# Patient Record
Sex: Female | Born: 1985 | Race: Black or African American | Hispanic: No | Marital: Single | Smoking: Current every day smoker
Health system: Southern US, Community
[De-identification: ages and names within clinical notes are randomized; demographics above are authoritative.]

## PROBLEM LIST (undated history)

## (undated) HISTORY — PX: TUBAL LIGATION: SHX77

---

## 2007-06-19 ENCOUNTER — Ambulatory Visit (HOSPITAL_COMMUNITY): Admission: RE | Admit: 2007-06-19 | Discharge: 2007-06-19 | Payer: Self-pay | Admitting: Obstetrics and Gynecology

## 2007-07-16 ENCOUNTER — Ambulatory Visit (HOSPITAL_COMMUNITY): Admission: RE | Admit: 2007-07-16 | Discharge: 2007-07-16 | Payer: Self-pay | Admitting: Obstetrics and Gynecology

## 2007-08-06 ENCOUNTER — Ambulatory Visit (HOSPITAL_COMMUNITY): Admission: RE | Admit: 2007-08-06 | Discharge: 2007-08-06 | Payer: Self-pay | Admitting: Obstetrics and Gynecology

## 2007-08-16 ENCOUNTER — Emergency Department (HOSPITAL_COMMUNITY): Admission: EM | Admit: 2007-08-16 | Discharge: 2007-08-17 | Payer: Self-pay | Admitting: Emergency Medicine

## 2007-09-03 ENCOUNTER — Ambulatory Visit (HOSPITAL_COMMUNITY): Admission: RE | Admit: 2007-09-03 | Discharge: 2007-09-03 | Payer: Self-pay | Admitting: Obstetrics and Gynecology

## 2007-10-03 ENCOUNTER — Ambulatory Visit (HOSPITAL_COMMUNITY): Admission: RE | Admit: 2007-10-03 | Discharge: 2007-10-03 | Payer: Self-pay | Admitting: Obstetrics and Gynecology

## 2007-10-26 ENCOUNTER — Ambulatory Visit (HOSPITAL_COMMUNITY): Admission: RE | Admit: 2007-10-26 | Discharge: 2007-10-26 | Payer: Self-pay | Admitting: Obstetrics and Gynecology

## 2007-12-26 ENCOUNTER — Inpatient Hospital Stay (HOSPITAL_COMMUNITY): Admission: RE | Admit: 2007-12-26 | Discharge: 2007-12-28 | Payer: Self-pay | Admitting: Obstetrics and Gynecology

## 2009-06-22 IMAGING — US US OB NUCHAL TRANSLUCENCY 1ST GEST
1 series · 14 of 28 positions shown · non-contrast
Comparison: none

OBSTETRICAL ULTRASOUND:
 This ultrasound was performed in The [HOSPITAL], and the [facility] report will be stored to [REDACTED] PACS.

[Series 1: us ob nuchal translucency 1st gest · 14 of 32 slices shown]
[im 2/32]
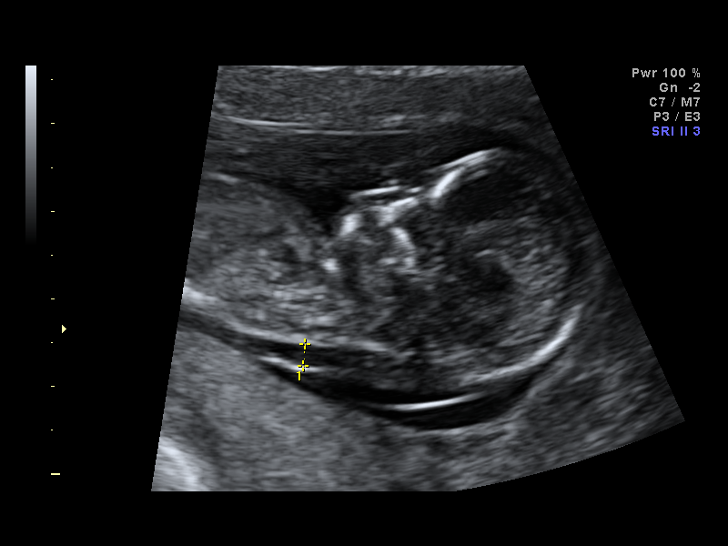
[im 4/32]
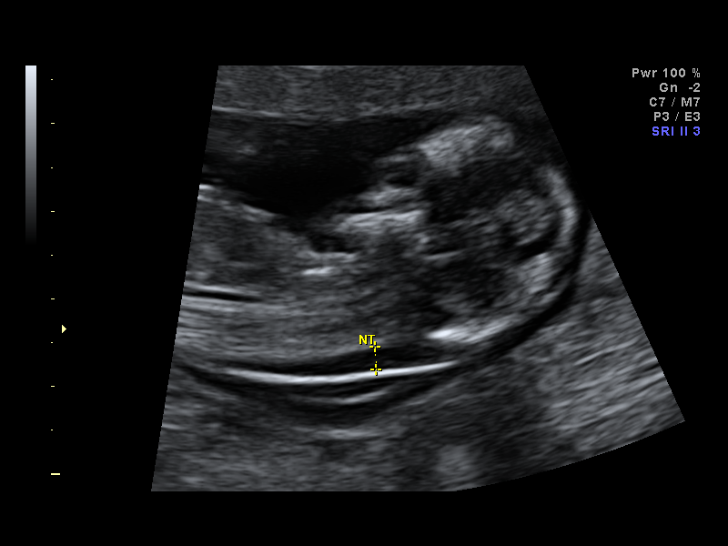
[im 6/32]
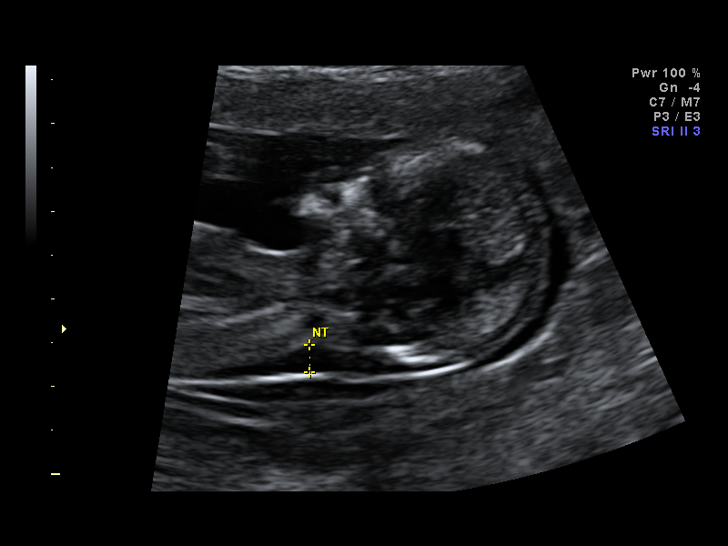
[im 9/32]
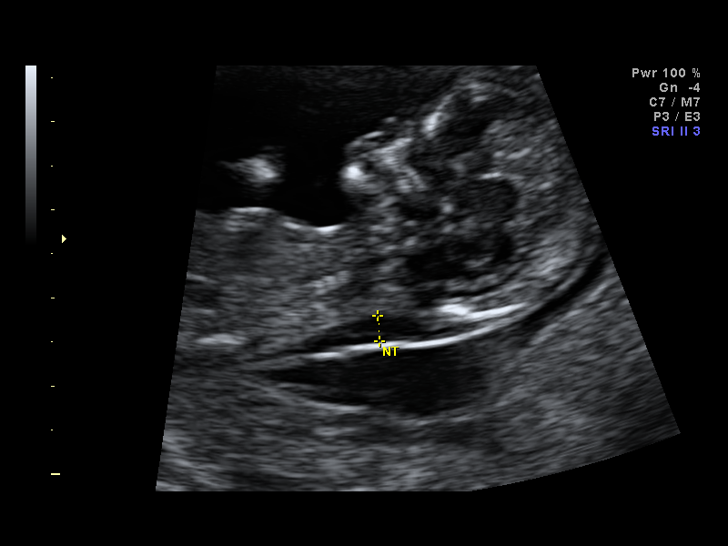
[im 11/32]
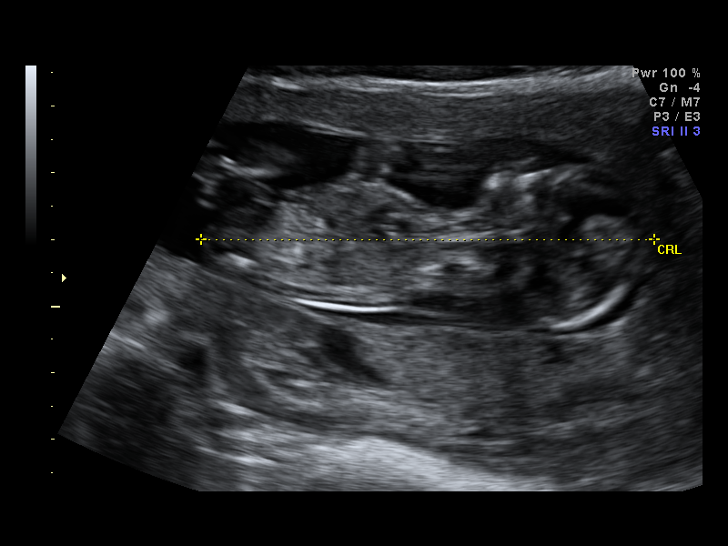
[im 13/32]
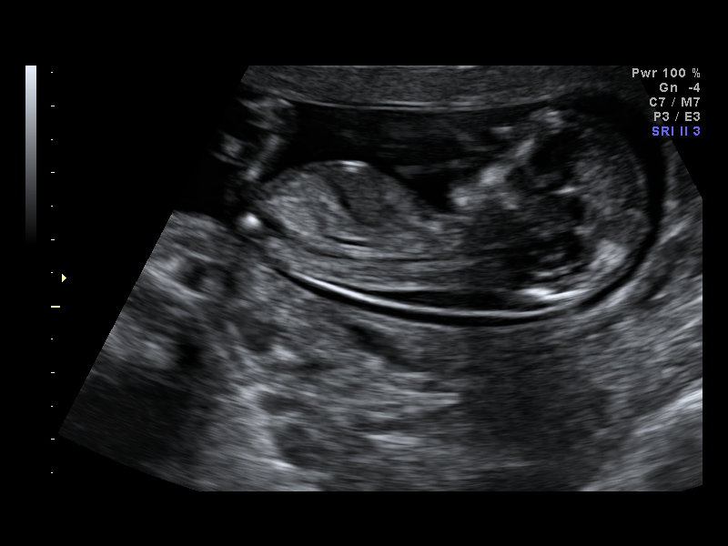
[im 15/32]
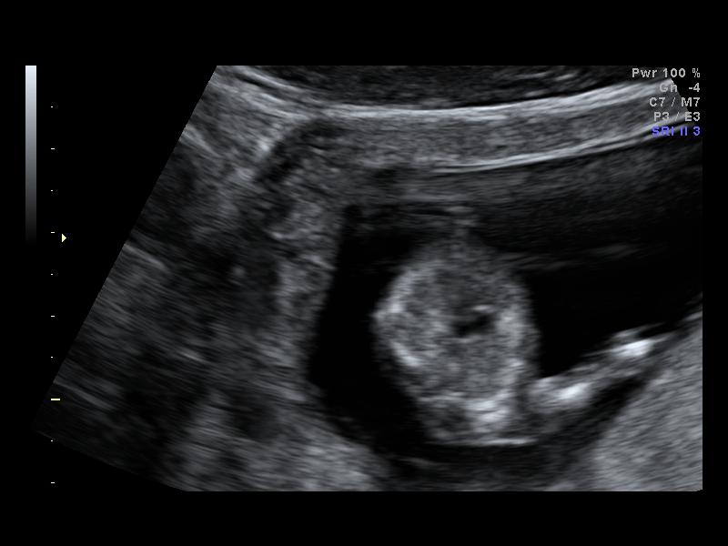
[im 18/32]
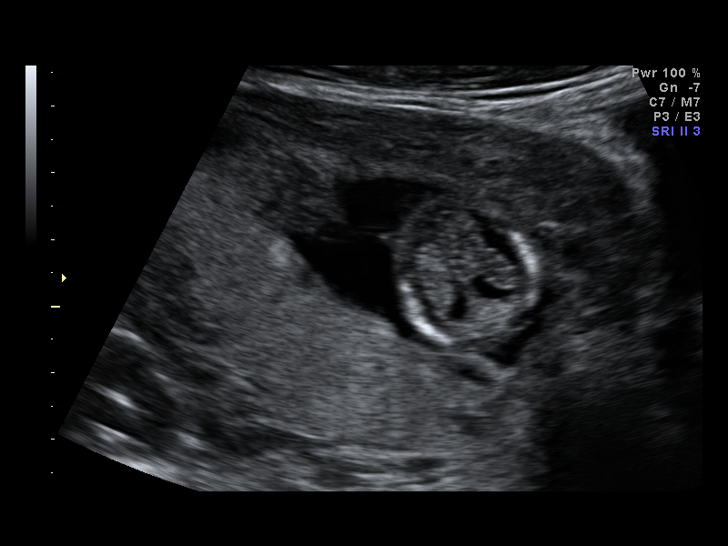
[im 20/32]
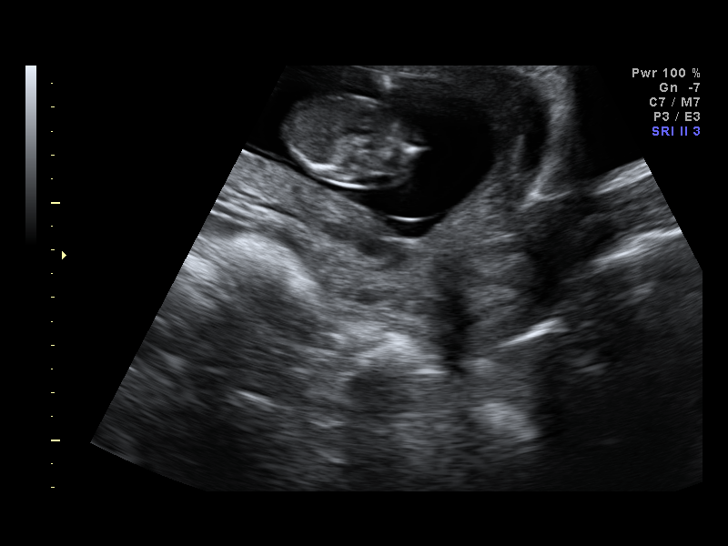
[im 22/32]
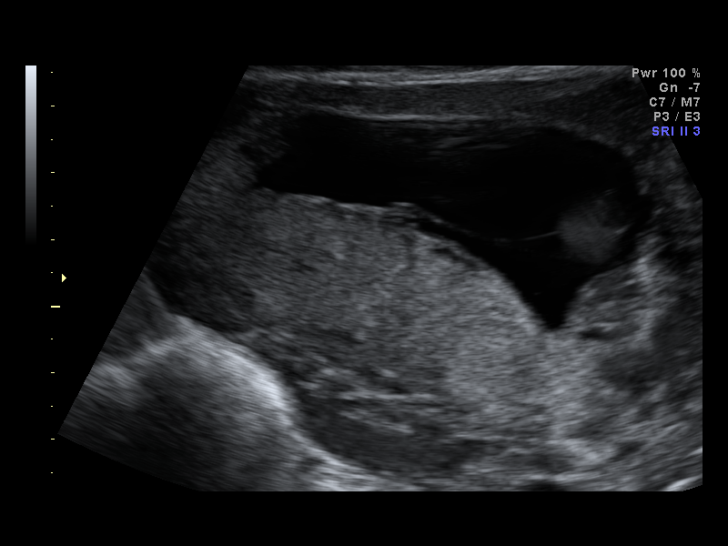
[im 25/32]
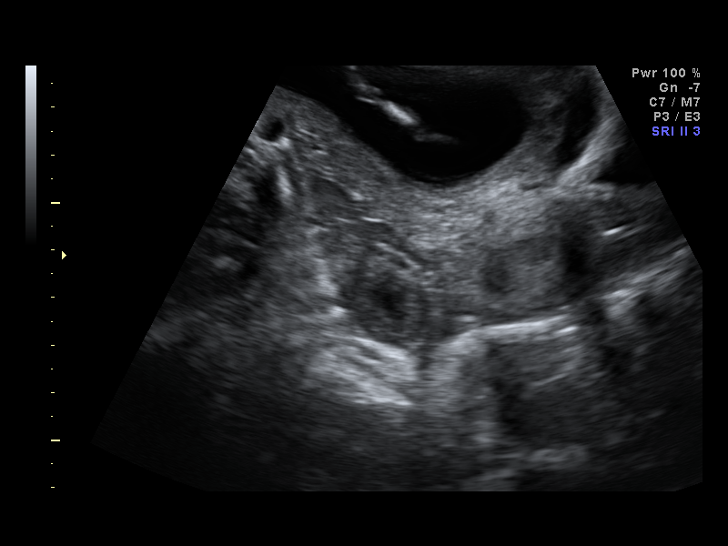
[im 27/32]
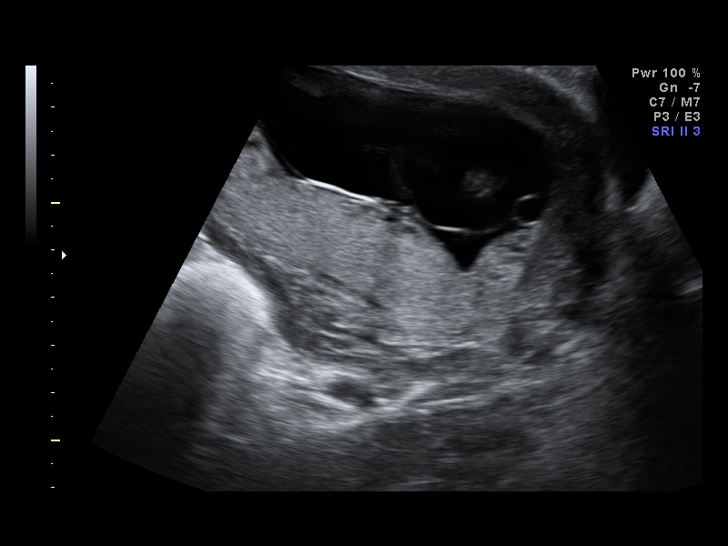
[im 29/32]
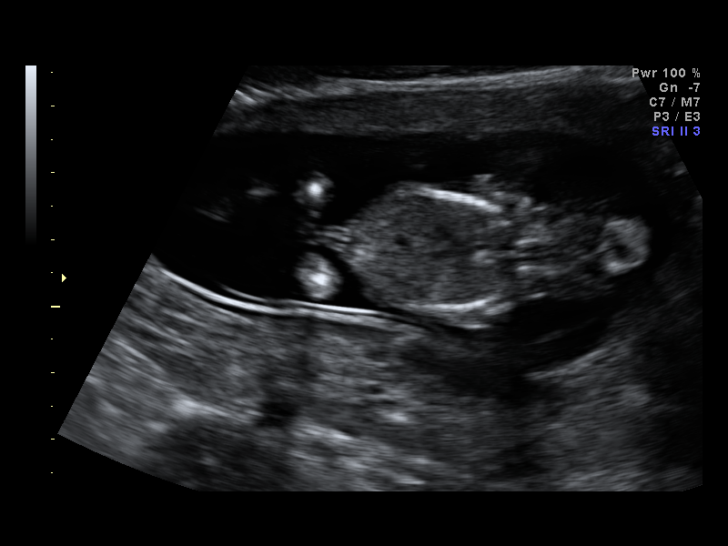
[im 32/32]
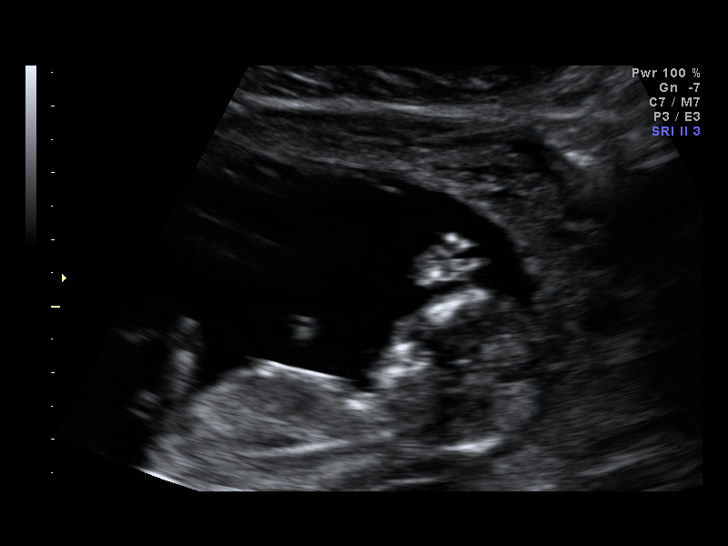

[14 of 28 positions shown; findings below may reference images not displayed]

IMPRESSION: The [facility] report has also been faxed to the ordering physician.

## 2010-05-30 ENCOUNTER — Encounter: Payer: Self-pay | Admitting: Obstetrics and Gynecology

## 2010-09-24 NOTE — Discharge Summary (Signed)
Felicia Cox, Felicia Cox            ACCOUNT NO.:  [id number]   MEDICAL RECORD NO.:  [id number]          PATIENT TYPE:  INP   LOCATION:  9132                          FACILITY:  [facility]   PHYSICIAN:  Malachi Pro. Ambrose Mantle, M.D. DATE OF BIRTH:  [birth year]   DATE OF ADMISSION:  12/26/2007  DATE OF DISCHARGE:  12/28/2007                               DISCHARGE SUMMARY   This is a 25 year old black single female para 0-0-1-0, gravida 2, EDC  December 25, 2007, admitted for induction of labor.  Blood group and type  A+, negative antibody, nonreactive serology, rubella immune, RPR  nonreactive, hepatitis B surface antigen negative, HIV negative, GC and  chlamydia negative, 1-hour Glucola 97.  Group B strep positive.  First  trimester screen 1-5 risk of down syndrome, 1-25 risk of trisomy 38,  amniocentesis 43 xx.  Vaginal ultrasound June 06, 2007.  Crown-rump  length 4.2 cm, 11 weeks 1 day, [facility] December 25, 2007.  Nuchal translucency  was 3.73 mm.  Amnio on July 16, 2007, 46 xx.  Prenatal care otherwise  uncomplicated except for slightly small for gestational age infant.   PAST MEDICAL HISTORY:  No known allergies.  No operations.  No  illnesses.   FAMILY HISTORY:  The patient was adopted.   OBSTETRICAL HISTORY:  In 2007 spontaneous abortion.  Alcohol, tobacco  and drugs none.   PHYSICAL EXAMINATION:  On admission, revealed normal vital signs.  Heart  and lungs were normal.  The abdomen was soft, fundal height 38 cm.  Fetal heart tones normal.  Cervix 1-2 cm, 50% vertex at -3.  The patient  was placed on Pitocin and given IV penicillin by 10:55 a.m.  The cervix  was a fingertip 50% vertex at -2-3.  Artificial rupture of membranes was  attempted, but unsuccessful.  At 1:50 p.m. the patient had already had  spontaneous rupture of membranes.  At approximately 11:00 a.m.  contractions were quite painful.  The cervix was 2 cm, 70% vertex at -2.  Pitocin was at 12 milliunits a minute at 5:20 p.m.  contractions every 3  minutes.  Cervix 4 cm, 80-90% vertex at -1.  The patient requested an  epidural.  She became fully dilated and began pushing.  She pushed well  and the vertex remained ROP.  She delivered spontaneously ROP over a  second-degree midline and right periurethral laceration.  A living  female infant 6, pounds 5 ounces, Apgars of 8 at 1 and 9 at 5 minutes.  Placenta was intact.  Uterus was normal.  Rectal was negative.  Lacerations were repaired with 2-0 and 3-0 Vicryl.  Blood loss about 400  mL.  Postpartum, the patient did well and was discharged on the second  postpartum day.   LABORATORY DATA:  Showed initial hemoglobin of 12.1, hematocrit 35.4,  white count 7900, and platelet count 276,000.  Followup hemoglobin 10.4,  RPR nonreactive.   FINAL DIAGNOSES:  Intrauterine pregnancy at 40 weeks with history of  abnormal first trimester screen, but normal amniocentesis, positive  group B strep, spontaneous delivery vertex, and repair of secondary  midline and right periurethral  lacerations.   FINAL CONDITION:  Improved.   INSTRUCTIONS:  Our regular discharge instruction booklet.  Percocet  5/325 mg 30 tablets, 1 every 6 hours as needed for pain and Motrin 600  mg, 30 tablets 1 every 6 hours as needed.  The patient is to return in 6  weeks.      Malachi Pro. Ambrose Mantle, M.D.  Electronically Signed     TFH/MEDQ  D:  01/10/2008  T:  01/10/2008  Job:  [id number]

## 2012-08-20 ENCOUNTER — Other Ambulatory Visit: Payer: Self-pay | Admitting: Obstetrics and Gynecology

## 2012-08-21 ENCOUNTER — Other Ambulatory Visit (HOSPITAL_COMMUNITY): Payer: Self-pay | Admitting: Obstetrics and Gynecology

## 2012-08-21 DIAGNOSIS — O2692 Pregnancy related conditions, unspecified, second trimester: Secondary | ICD-10-CM

## 2012-08-21 DIAGNOSIS — O358XX0 Maternal care for other (suspected) fetal abnormality and damage, not applicable or unspecified: Secondary | ICD-10-CM

## 2012-08-29 ENCOUNTER — Ambulatory Visit (HOSPITAL_COMMUNITY)
Admission: RE | Admit: 2012-08-29 | Discharge: 2012-08-29 | Disposition: A | Discharge status: Home / Self Care | Payer: Medicaid Other | Source: Ambulatory Visit | Attending: Obstetrics and Gynecology | Admitting: Obstetrics and Gynecology

## 2012-08-29 ENCOUNTER — Other Ambulatory Visit: Payer: Self-pay

## 2012-08-29 ENCOUNTER — Encounter (HOSPITAL_COMMUNITY): Payer: Self-pay

## 2012-08-29 VITALS — BP 118/67 | HR 102 | Wt 145.0 lb

## 2012-08-29 DIAGNOSIS — O358XX0 Maternal care for other (suspected) fetal abnormality and damage, not applicable or unspecified: Secondary | ICD-10-CM | POA: Insufficient documentation

## 2012-08-29 DIAGNOSIS — O2692 Pregnancy related conditions, unspecified, second trimester: Secondary | ICD-10-CM

## 2012-08-29 DIAGNOSIS — O35EXX0 Maternal care for other (suspected) fetal abnormality and damage, fetal genitourinary anomalies, not applicable or unspecified: Secondary | ICD-10-CM

## 2012-08-29 DIAGNOSIS — Z363 Encounter for antenatal screening for malformations: Secondary | ICD-10-CM | POA: Insufficient documentation

## 2012-08-29 DIAGNOSIS — Z1389 Encounter for screening for other disorder: Secondary | ICD-10-CM | POA: Insufficient documentation

## 2012-09-11 ENCOUNTER — Telehealth (HOSPITAL_COMMUNITY): Payer: Self-pay | Admitting: MS"

## 2012-09-11 NOTE — Telephone Encounter (Signed)
Called Felicia Cox to discuss her Harm[facility], cell free fetal DNA testing.  Was unable to leave a message as the voice mail has not been set up; however several hours later the patient saw the missed call and returned the call.  Patient was identified by name and DOB. Testing was offered because of the ultrasound finding of pyelectesis. We reviewed that these are within normal limits, showing a less than 1 in 10,000 risk for trisomies 21, 18 and 13. We reviewed that this testing identifies > 99% of pregnancies with trisomy 21, >98% of pregnancies with trisomy 40, and >80% with trisomy 51; the false positive rate is <0.1% for all conditions. She understands that this testing does not identify all genetic conditions. All questions were answered to her satisfaction, she was encouraged to call with additional questions or concerns.  [name], MS Certified Genetic Counselor

## 2012-10-10 ENCOUNTER — Ambulatory Visit (HOSPITAL_COMMUNITY): Payer: Medicaid Other | Attending: Obstetrics and Gynecology

## 2013-07-04 ENCOUNTER — Encounter (HOSPITAL_COMMUNITY): Payer: Self-pay | Admitting: *Deleted

## 2014-03-10 ENCOUNTER — Encounter (HOSPITAL_COMMUNITY): Payer: Self-pay | Admitting: *Deleted

## 2016-01-25 ENCOUNTER — Emergency Department (HOSPITAL_COMMUNITY)
Admission: EM | Admit: 2016-01-25 | Discharge: 2016-01-25 | Disposition: A | Discharge status: Home / Self Care | Payer: Medicaid Other | Attending: Emergency Medicine | Admitting: Emergency Medicine

## 2016-01-25 ENCOUNTER — Encounter (HOSPITAL_COMMUNITY): Payer: Self-pay | Admitting: Emergency Medicine

## 2016-01-25 DIAGNOSIS — R109 Unspecified abdominal pain: Secondary | ICD-10-CM | POA: Insufficient documentation

## 2016-01-25 DIAGNOSIS — Z5321 Procedure and treatment not carried out due to patient leaving prior to being seen by health care provider: Secondary | ICD-10-CM | POA: Insufficient documentation

## 2016-01-25 DIAGNOSIS — N390 Urinary tract infection, site not specified: Secondary | ICD-10-CM | POA: Insufficient documentation

## 2016-01-25 DIAGNOSIS — F17200 Nicotine dependence, unspecified, uncomplicated: Secondary | ICD-10-CM | POA: Insufficient documentation

## 2016-01-25 LAB — URINE MICROSCOPIC-ADD ON

## 2016-01-25 LAB — COMPREHENSIVE METABOLIC PANEL
ALBUMIN: 3.9 g/dL (ref 3.5–5.0)
ALT: 14 U/L (ref 14–54)
AST: 15 U/L (ref 15–41)
Alkaline Phosphatase: 28 U/L — ABNORMAL LOW (ref 38–126)
Anion gap: 9 (ref 5–15)
BUN: 12 mg/dL (ref 6–20)
CHLORIDE: 99 mmol/L — AB (ref 101–111)
CO2: 24 mmol/L (ref 22–32)
CREATININE: 0.87 mg/dL (ref 0.44–1.00)
Calcium: 9.1 mg/dL (ref 8.9–10.3)
GFR calc Af Amer: >60 mL/min (ref >60)
GLUCOSE: 173 mg/dL — AB (ref 65–99)
Potassium: 3.4 mmol/L — ABNORMAL LOW (ref 3.5–5.1)
Sodium: 132 mmol/L — ABNORMAL LOW (ref 135–145)
Total Bilirubin: 0.8 mg/dL (ref 0.3–1.2)
Total Protein: 7.2 g/dL (ref 6.5–8.1)

## 2016-01-25 LAB — URINALYSIS, ROUTINE W REFLEX MICROSCOPIC
GLUCOSE, UA: NEGATIVE mg/dL
KETONES UR: 15 mg/dL — AB
Nitrite: POSITIVE — AB
PH: 5 (ref 5.0–8.0)
PROTEIN: 100 mg/dL — AB
Specific Gravity, Urine: 1.021 (ref 1.005–1.030)

## 2016-01-25 LAB — CBC
HEMATOCRIT: 38.1 % (ref 36.0–46.0)
Hemoglobin: 12.7 g/dL (ref 12.0–15.0)
MCH: 29.4 pg (ref 26.0–34.0)
MCHC: 33.3 g/dL (ref 30.0–36.0)
MCV: 88.2 fL (ref 78.0–100.0)
PLATELETS: 239 10*3/uL (ref 150–400)
RBC: 4.32 MIL/uL (ref 3.87–5.11)
RDW: 12.9 % (ref 11.5–15.5)
WBC: 13.6 10*3/uL — AB (ref 4.0–10.5)

## 2016-01-25 LAB — PREGNANCY, URINE: PREG TEST UR: NEGATIVE

## 2016-01-25 MED ORDER — SODIUM CHLORIDE 0.9 % IV BOLUS (SEPSIS): 1000.0000 mL | Freq: Once | INTRAVENOUS | Status: DC

## 2016-01-25 MED ORDER — ONDANSETRON HCL 4 MG/2ML IJ SOLN: 4.0000 mg | Freq: Once | INTRAMUSCULAR | Status: DC

## 2016-01-25 MED ORDER — MORPHINE SULFATE (PF) 4 MG/ML IV SOLN: 4.0000 mg | Freq: Once | INTRAVENOUS | Status: DC

## 2016-01-25 MED ORDER — DEXTROSE 5 % IV SOLN: 1.0000 g | Freq: Once | INTRAVENOUS | Status: DC

## 2016-01-25 NOTE — ED Notes (Signed)
Called for Pt to RM, No answer x's 3.

## 2016-01-25 NOTE — ED Provider Notes (Signed)
Patient left without being seen. I didn't establish contact with patient.    Charlynne Panderavid Hsienta Yao, MD 01/25/16 [phone]

## 2016-01-25 NOTE — ED Triage Notes (Signed)
Patient arrives with complaint of dysuria, urgency, frequency, and bilateral flank pain. States onset of all symptoms sans flank pain at 1 week ago. Today the flank pain started. Endorses use of OTC and home remedies for UTI without effect. Denies fever, and normothermic in triage.

## 2016-01-25 NOTE — ED Notes (Signed)
Pts name called for a room no answer 

## 2016-01-27 ENCOUNTER — Emergency Department (HOSPITAL_COMMUNITY): Payer: Self-pay

## 2016-01-27 ENCOUNTER — Emergency Department (HOSPITAL_COMMUNITY)
Admission: EM | Admit: 2016-01-27 | Discharge: 2016-01-27 | Disposition: A | Discharge status: Home / Self Care | Payer: Self-pay | Attending: Emergency Medicine | Admitting: Emergency Medicine

## 2016-01-27 ENCOUNTER — Encounter (HOSPITAL_COMMUNITY): Payer: Self-pay

## 2016-01-27 DIAGNOSIS — N390 Urinary tract infection, site not specified: Secondary | ICD-10-CM | POA: Insufficient documentation

## 2016-01-27 DIAGNOSIS — F17200 Nicotine dependence, unspecified, uncomplicated: Secondary | ICD-10-CM | POA: Insufficient documentation

## 2016-01-27 LAB — COMPREHENSIVE METABOLIC PANEL
ALBUMIN: 3.8 g/dL (ref 3.5–5.0)
ALK PHOS: 35 U/L — AB (ref 38–126)
ALT: 27 U/L (ref 14–54)
AST: 23 U/L (ref 15–41)
Anion gap: 10 (ref 5–15)
BILIRUBIN TOTAL: 0.9 mg/dL (ref 0.3–1.2)
BUN: 11 mg/dL (ref 6–20)
CALCIUM: 9.2 mg/dL (ref 8.9–10.3)
CO2: 20 mmol/L — ABNORMAL LOW (ref 22–32)
Chloride: 104 mmol/L (ref 101–111)
Creatinine, Ser: 0.83 mg/dL (ref 0.44–1.00)
GFR calc Af Amer: >60 mL/min (ref >60)
GFR calc non Af Amer: >60 mL/min (ref >60)
GLUCOSE: 114 mg/dL — AB (ref 65–99)
Potassium: 3.5 mmol/L (ref 3.5–5.1)
Sodium: 134 mmol/L — ABNORMAL LOW (ref 135–145)
TOTAL PROTEIN: 7 g/dL (ref 6.5–8.1)

## 2016-01-27 LAB — CBC WITH DIFFERENTIAL/PLATELET
Basophils Absolute: 0 10*3/uL (ref 0.0–0.1)
Basophils Relative: 0 %
EOS PCT: 0 %
Eosinophils Absolute: 0 10*3/uL (ref 0.0–0.7)
HCT: 38.4 % (ref 36.0–46.0)
Hemoglobin: 13 g/dL (ref 12.0–15.0)
LYMPHS ABS: 0.5 10*3/uL — AB (ref 0.7–4.0)
LYMPHS PCT: 4 %
MCH: 29.4 pg (ref 26.0–34.0)
MCHC: 33.9 g/dL (ref 30.0–36.0)
MCV: 86.9 fL (ref 78.0–100.0)
MONO ABS: 0.5 10*3/uL (ref 0.1–1.0)
Monocytes Relative: 4 %
Neutro Abs: 12.9 10*3/uL — ABNORMAL HIGH (ref 1.7–7.7)
Neutrophils Relative %: 92 %
PLATELETS: 210 10*3/uL (ref 150–400)
RBC: 4.42 MIL/uL (ref 3.87–5.11)
RDW: 13 % (ref 11.5–15.5)
WBC: 14 10*3/uL — AB (ref 4.0–10.5)

## 2016-01-27 LAB — I-STAT BETA HCG BLOOD, ED (MC, WL, AP ONLY): I-stat hCG, quantitative: <5 m[IU]/mL (ref <5)

## 2016-01-27 LAB — I-STAT CG4 LACTIC ACID, ED: Lactic Acid, Venous: 0.93 mmol/L (ref 0.5–1.9)

## 2016-01-27 MED ORDER — STERILE WATER FOR INJECTION IJ SOLN
INTRAMUSCULAR | Status: AC
  Filled 2016-01-27: qty 10

## 2016-01-27 MED ORDER — PHENAZOPYRIDINE HCL 200 MG PO TABS: 200.0000 mg | ORAL_TABLET | Freq: Three times a day (TID) | ORAL | 0 refills | Status: AC | PRN

## 2016-01-27 MED ORDER — CEPHALEXIN 500 MG PO CAPS: 500.0000 mg | ORAL_CAPSULE | Freq: Four times a day (QID) | ORAL | 0 refills | Status: AC

## 2016-01-27 MED ORDER — CEFTRIAXONE SODIUM 1 G IJ SOLR
1.0000 g | Freq: Once | INTRAMUSCULAR | Status: AC
  Administered 2016-01-27: 1 g via INTRAMUSCULAR
  Filled 2016-01-27: qty 10

## 2016-01-27 MED ORDER — HYDROCODONE-ACETAMINOPHEN 5-325 MG PO TABS: 1.0000 | ORAL_TABLET | Freq: Four times a day (QID) | ORAL | 0 refills | Status: AC | PRN

## 2016-01-27 NOTE — ED Triage Notes (Signed)
Pt states that she had a UTI and was taking AZO without relief, now having R sided flank pain and back pain.

## 2016-01-27 NOTE — ED Provider Notes (Signed)
[facility]-EMERGENCY DEPT Provider Note   CSN: [id number] Arrival date & time: 01/27/16  0430     History   Chief Complaint Chief Complaint  Patient presents with  . Urinary Tract Infection  . Blood Infection    HPI Felicia Cox is a 30 y.o. female.  Patient is a 30 year old female who presents with a one-week history of burning with urination. She attempted to take Azo over the counter with little relief. She came in yesterday to be seen, however the wait was too long and she left. Her urinalysis from yesterday reveals a UTI. She also has a white count of 13,000. She returns today with ongoing burning with urination and pain in her right flank. She denies any fevers or chills. She reports one episode of vomiting, but denies diarrhea. Her last menstrual period was 2 weeks ago and normal.      History reviewed. No pertinent past medical history.  There are no active problems to display for this patient.   Past Surgical History:  Procedure Laterality Date  . TUBAL LIGATION      OB History    Gravida Para Term Preterm AB Living   3 1 1  0 1 1   SAB TAB Ectopic Multiple Live Births   1 0 0 0         Home Medications    Prior to Admission medications   Not on File    Family History No family history on file.  Social History Social History  Substance Use Topics  . Smoking status: Current Every Day Smoker  . Smokeless tobacco: Never Used  . Alcohol use No     Allergies   Review of patient's allergies indicates no known allergies.   Review of Systems Review of Systems  All other systems reviewed and are negative.    Physical Exam Updated Vital Signs BP 116/77   Pulse 120   Temp 99.9 F (37.7 C) (Oral)   Resp 20   Ht 5\' 1"  (1.549 m)   Wt 135 lb (61.2 kg)   LMP 01/11/2016 (Exact Date)   SpO2 100%   BMI 25.51 kg/m   Physical Exam  Constitutional: She is oriented to person, place, and time. She appears well-developed and well-nourished. No  distress.  HENT:  Head: Normocephalic and atraumatic.  Neck: Normal range of motion. Neck supple.  Cardiovascular: Normal rate and regular rhythm.  Exam reveals no gallop and no friction rub.   No murmur heard. Pulmonary/Chest: Effort normal and breath sounds normal. No respiratory distress. She has no wheezes.  Abdominal: Soft. Bowel sounds are normal. She exhibits no distension. There is tenderness.  There is mild right sided cva tenderness.  There is also mild right lower quadrant and suprapubic ttp with no rebound or guarding.  Musculoskeletal: Normal range of motion.  Neurological: She is alert and oriented to person, place, and time.  Skin: Skin is warm and dry. She is not diaphoretic.  Nursing note and vitals reviewed.    ED Treatments / Results  Labs (all labs ordered are listed, but only abnormal results are displayed) Labs Reviewed  COMPREHENSIVE METABOLIC PANEL - Abnormal; Notable for the following:       Result Value   Sodium 134 (*)    CO2 20 (*)    Glucose, Bld 114 (*)    Alkaline Phosphatase 35 (*)    All other components within normal limits  CBC WITH DIFFERENTIAL/PLATELET - Abnormal; Notable for the following:  WBC 14.0 (*)    Neutro Abs 12.9 (*)    Lymphs Abs 0.5 (*)    All other components within normal limits  CULTURE, BLOOD (ROUTINE X 2)  CULTURE, BLOOD (ROUTINE X 2)  URINE CULTURE  URINALYSIS, ROUTINE W REFLEX MICROSCOPIC (NOT AT [facility])  I-STAT BETA HCG BLOOD, ED ([facility], [facility], [facility] ONLY)  I-STAT CG4 LACTIC ACID, ED    EKG  EKG Interpretation None       Radiology Dg Chest 2 View  Result Date: 01/27/2016 CLINICAL DATA:  Right-sided back pain. Recent urinary tract infection for 1 week. Back pain 3 days ago. Low grade fever. EXAM: CHEST  2 VIEW COMPARISON:  None. FINDINGS: The heart size and mediastinal contours are within normal limits. Both lungs are clear. The visualized skeletal structures are unremarkable. IMPRESSION: No active cardiopulmonary  disease. Electronically Signed   By: Burman Nieves M.D.   On: 01/27/2016 05:07    Procedures Procedures (including critical care time)  Medications Ordered in ED Medications - No data to display   Initial Impression / Assessment and Plan / ED Course  I have reviewed the triage vital signs and the nursing notes.  Pertinent labs & imaging results that were available during my care of the patient were reviewed by me and considered in my medical decision making (see chart for details).  Clinical Course    Patient will be treated for UTI/Pyelonephritis with rocephin, keflex. To return as needed.   Final Clinical Impressions(s) / ED Diagnoses   Final diagnoses:  None    New Prescriptions New Prescriptions   No medications on file     Geoffery Lyons, MD 01/27/16 [phone]

## 2016-01-27 NOTE — Discharge Instructions (Signed)
Keflex and pyridium as prescribed.  Hydrocodone as prescribed as needed for pain.  Return to the ER if symptoms significantly worsen or change.

## 2016-01-27 NOTE — ED Notes (Signed)
Pt verbalized understanding of discharge instructions and follow up care. Pin pad in room not working at this time.

## 2016-01-28 LAB — URINE CULTURE

## 2016-01-29 ENCOUNTER — Telehealth (HOSPITAL_BASED_OUTPATIENT_CLINIC_OR_DEPARTMENT_OTHER): Payer: Self-pay

## 2016-01-29 LAB — URINE CULTURE

## 2016-01-29 NOTE — Telephone Encounter (Signed)
Post ED Visit - Positive Culture Follow-up  Culture report reviewed by antimicrobial stewardship pharmacist:  []  Felicia Cox, Pharm.D. []  Felicia Cox, Pharm.D., BCPS []  Felicia Cox, Pharm.D. []  Felicia Cox, Pharm.D., BCPS []  Felicia Cox, [address]Ph[address]D., BCPS, AAHIVP []  Felicia Cox, Pharm.D., BCPS, AAHIVP []  Felicia Cox, Pharm.D. []  Felicia Cox, [place]Felicia Cox Pharm D Positive urine culture and no further patient follow-up is required at this time.  Felicia Cox, Felicia Cox 01/29/2016, 9:04 AM

## 2016-01-30 ENCOUNTER — Telehealth (HOSPITAL_BASED_OUTPATIENT_CLINIC_OR_DEPARTMENT_OTHER): Payer: Self-pay

## 2016-01-30 NOTE — Telephone Encounter (Signed)
Post ED Visit - Positive Culture Follow-up  Culture report reviewed by antimicrobial stewardship pharmacist:  []  Enzo BiNathan Batchelder, Pharm.D. []  Celedonio MiyamotoJeremy Frens, Pharm.D., BCPS []  Garvin FilaMike Maccia, Pharm.D. []  Georgina PillionElizabeth Martin, Pharm.D., BCPS []  DoverMinh Pham, [address]Ph[address]D., BCPS, AAHIVP []  Estella HuskMichelle Turner, Pharm.D., BCPS, AAHIVP []  Tennis Mustassie Stewart, Pharm.D. []  Sherle Poeob Vincent, [address] Rachel Rumsbarger Pharm D Positive urine culture Treated with Cephalexin, organism sensitive to the same and no further patient follow-up is required at this time.  Jerry CarasCullom, Savilla Turbyfill Burnett 01/30/2016, 10:05 AM

## 2016-02-01 LAB — CULTURE, BLOOD (ROUTINE X 2)
CULTURE: NO GROWTH
Culture: NO GROWTH

## 2017-11-30 ENCOUNTER — Encounter (HOSPITAL_COMMUNITY): Payer: Self-pay | Admitting: Emergency Medicine

## 2017-11-30 ENCOUNTER — Emergency Department (HOSPITAL_COMMUNITY)
Admission: EM | Admit: 2017-11-30 | Discharge: 2017-11-30 | Disposition: A | Discharge status: Home / Self Care | Payer: Self-pay | Attending: Emergency Medicine | Admitting: Emergency Medicine

## 2017-11-30 DIAGNOSIS — M79602 Pain in left arm: Secondary | ICD-10-CM | POA: Insufficient documentation

## 2017-11-30 DIAGNOSIS — F17200 Nicotine dependence, unspecified, uncomplicated: Secondary | ICD-10-CM | POA: Insufficient documentation

## 2017-11-30 LAB — BASIC METABOLIC PANEL
ANION GAP: 7 (ref 5–15)
BUN: 7 mg/dL (ref 6–20)
CHLORIDE: 107 mmol/L (ref 98–111)
CO2: 25 mmol/L (ref 22–32)
Calcium: 9.3 mg/dL (ref 8.9–10.3)
Creatinine, Ser: 0.89 mg/dL (ref 0.44–1.00)
GFR calc non Af Amer: >60 mL/min (ref >60)
GLUCOSE: 91 mg/dL (ref 70–99)
POTASSIUM: 3.9 mmol/L (ref 3.5–5.1)
Sodium: 139 mmol/L (ref 135–145)

## 2017-11-30 LAB — CBC WITH DIFFERENTIAL/PLATELET
BASOS ABS: 0 10*3/uL (ref 0.0–0.1)
Basophils Relative: 0 %
Eosinophils Absolute: 0.1 10*3/uL (ref 0.0–0.7)
Eosinophils Relative: 1 %
HEMATOCRIT: 37.4 % (ref 36.0–46.0)
HEMOGLOBIN: 12.7 g/dL (ref 12.0–15.0)
LYMPHS PCT: 23 %
Lymphs Abs: 2.2 10*3/uL (ref 0.7–4.0)
MCH: 30.2 pg (ref 26.0–34.0)
MCHC: 34 g/dL (ref 30.0–36.0)
MCV: 88.8 fL (ref 78.0–100.0)
Monocytes Absolute: 0.3 10*3/uL (ref 0.1–1.0)
Monocytes Relative: 4 %
NEUTROS ABS: 6.8 10*3/uL (ref 1.7–7.7)
NEUTROS PCT: 72 %
Platelets: 326 10*3/uL (ref 150–400)
RBC: 4.21 MIL/uL (ref 3.87–5.11)
RDW: 13.2 % (ref 11.5–15.5)
WBC: 9.4 10*3/uL (ref 4.0–10.5)

## 2017-11-30 NOTE — ED Triage Notes (Signed)
Pt is c/o left AC pain from a previous IV stick at the [facility]. Pt stated she had an IV placed and the facility said there was something wrong and removed the IV and placed ice on it and stuck her other arm. A few days later the pt stated that a bruise formed and she is now unable to extend her left arm without feeling like it is "pulling." This has been going on for 3 weeks. Plasma center sent her here for a possible blood clot.

## 2017-11-30 NOTE — Discharge Instructions (Addendum)
Call [phone]

## 2017-12-01 ENCOUNTER — Ambulatory Visit (HOSPITAL_COMMUNITY)
Admission: RE | Admit: 2017-12-01 | Discharge: 2017-12-01 | Disposition: A | Discharge status: Home / Self Care | Payer: Self-pay | Source: Ambulatory Visit | Attending: Physician Assistant | Admitting: Physician Assistant

## 2017-12-01 DIAGNOSIS — M79602 Pain in left arm: Secondary | ICD-10-CM | POA: Insufficient documentation

## 2017-12-01 NOTE — Progress Notes (Signed)
LUE venous duplex prelim: negative for DVT and SVT. Shela Esses Eunice, RDMS, RVT   

## 2017-12-01 NOTE — ED Provider Notes (Signed)
[facility]-EMERGENCY DEPT Provider Note   CSN: [id number] Arrival date & time: 11/30/17  1835     History   Chief Complaint Chief Complaint  Patient presents with  . Arm Pain    HPI Felicia Cox is a 32 y.o. female.  The history is provided by the patient. No language interpreter was used.  Arm Pain  This is a new problem. The current episode started more than 1 week ago. The problem occurs constantly. The problem has been gradually worsening. Pertinent negatives include no shortness of breath. Nothing aggravates the symptoms. Nothing relieves the symptoms. She has tried nothing for the symptoms. The treatment provided no relief.   Pt reports she gave plasm 2 weeks ago.  Pt reports swelling at site since.  Pt sent in by provider at plasma center to evaluate for dvt.  History reviewed. No pertinent past medical history.  There are no active problems to display for this patient.   Past Surgical History:  Procedure Laterality Date  . TUBAL LIGATION       OB History    Gravida  3   Para  1   Term  1   Preterm  0   AB  1   Living  1     SAB  1   TAB  0   Ectopic  0   Multiple  0   Live Births               Home Medications    Prior to Admission medications   Medication Sig Start Date End Date Taking? Authorizing Provider  cephALEXin (KEFLEX) 500 MG capsule Take 1 capsule (500 mg total) by mouth 4 (four) times daily. Patient not taking: Reported on 11/30/2017 01/27/16   Geoffery Lyonselo, Douglas, MD  HYDROcodone-acetaminophen (NORCO) 5-325 MG tablet Take 1-2 tablets by mouth every 6 (six) hours as needed. Patient not taking: Reported on 11/30/2017 01/27/16   Geoffery Lyonselo, Douglas, MD  phenazopyridine (PYRIDIUM) 200 MG tablet Take 1 tablet (200 mg total) by mouth 3 (three) times daily as needed for pain. Patient not taking: Reported on 11/30/2017 01/27/16   Geoffery Lyonselo, Douglas, MD    Family History History reviewed. No pertinent family  history.  Social History Social History   Tobacco Use  . Smoking status: Current Every Day Smoker  . Smokeless tobacco: Never Used  Substance Use Topics  . Alcohol use: No  . Drug use: No     Allergies   Patient has no known allergies.   Review of Systems Review of Systems  Respiratory: Negative for shortness of breath.   Skin: Negative for color change.  All other systems reviewed and are negative.    Physical Exam Updated Vital Signs BP 123/69 (BP Location: Right Arm)   Pulse 86   Temp 98.7 F (37.1 C) (Oral)   Resp 15   Ht 5\' 2"  (1.575 m)   Wt 61.2 kg (135 lb)   SpO2 100%   BMI 24.69 kg/m   Physical Exam  Constitutional: She is oriented to person, place, and time. She appears well-developed and well-nourished.  HENT:  Head: Normocephalic.  Eyes: EOM are normal.  Neck: Normal range of motion.  Pulmonary/Chest: Effort normal.  Abdominal: She exhibits no distension.  Musculoskeletal: Normal range of motion.  Bruised swollen left arm,    Neurological: She is alert and oriented to person, place, and time.  Skin: Skin is warm.  Psychiatric: She has a normal mood and affect.  Nursing note and vitals reviewed.    ED Treatments / Results  Labs (all labs ordered are listed, but only abnormal results are displayed) Labs Reviewed  CBC WITH DIFFERENTIAL/PLATELET  BASIC METABOLIC PANEL    EKG None  Radiology No results found.  Procedures Procedures (including critical care time)  Medications Ordered in ED Medications - No data to display   Initial Impression / Assessment and Plan / ED Course  I have reviewed the triage vital signs and the nursing notes.  Pertinent labs & imaging results that were available during my care of the patient were reviewed by me and considered in my medical decision making (see chart for details).     MDM  Area appears to be bruised.  I am not going to start blood thinners.  Pt is advised to return for vascular  ultrasound  Final Clinical Impressions(s) / ED Diagnoses   Final diagnoses:  Left arm pain    ED Discharge Orders        Ordered    UE VENOUS DUPLEX  Status:  Canceled     11/30/17 1939    VAS [place] UPPER EXTREMITY VENOUS DUPLEX     11/30/17 1939    An After Visit Summary was printed and given to the patient.    Elson Areas, [place] 12/01/17 1630    Linwood Dibbles, MD 12/04/17 1031

## 2018-01-30 IMAGING — DX DG CHEST 2V
2 series · 2 of 2 positions shown · non-contrast
Comparison: None.

CLINICAL DATA: Right-sided back pain. Recent urinary tract
infection for 1 week. Back pain 3 days ago. Low grade fever.

EXAM:
CHEST  2 VIEW

[chest pa]
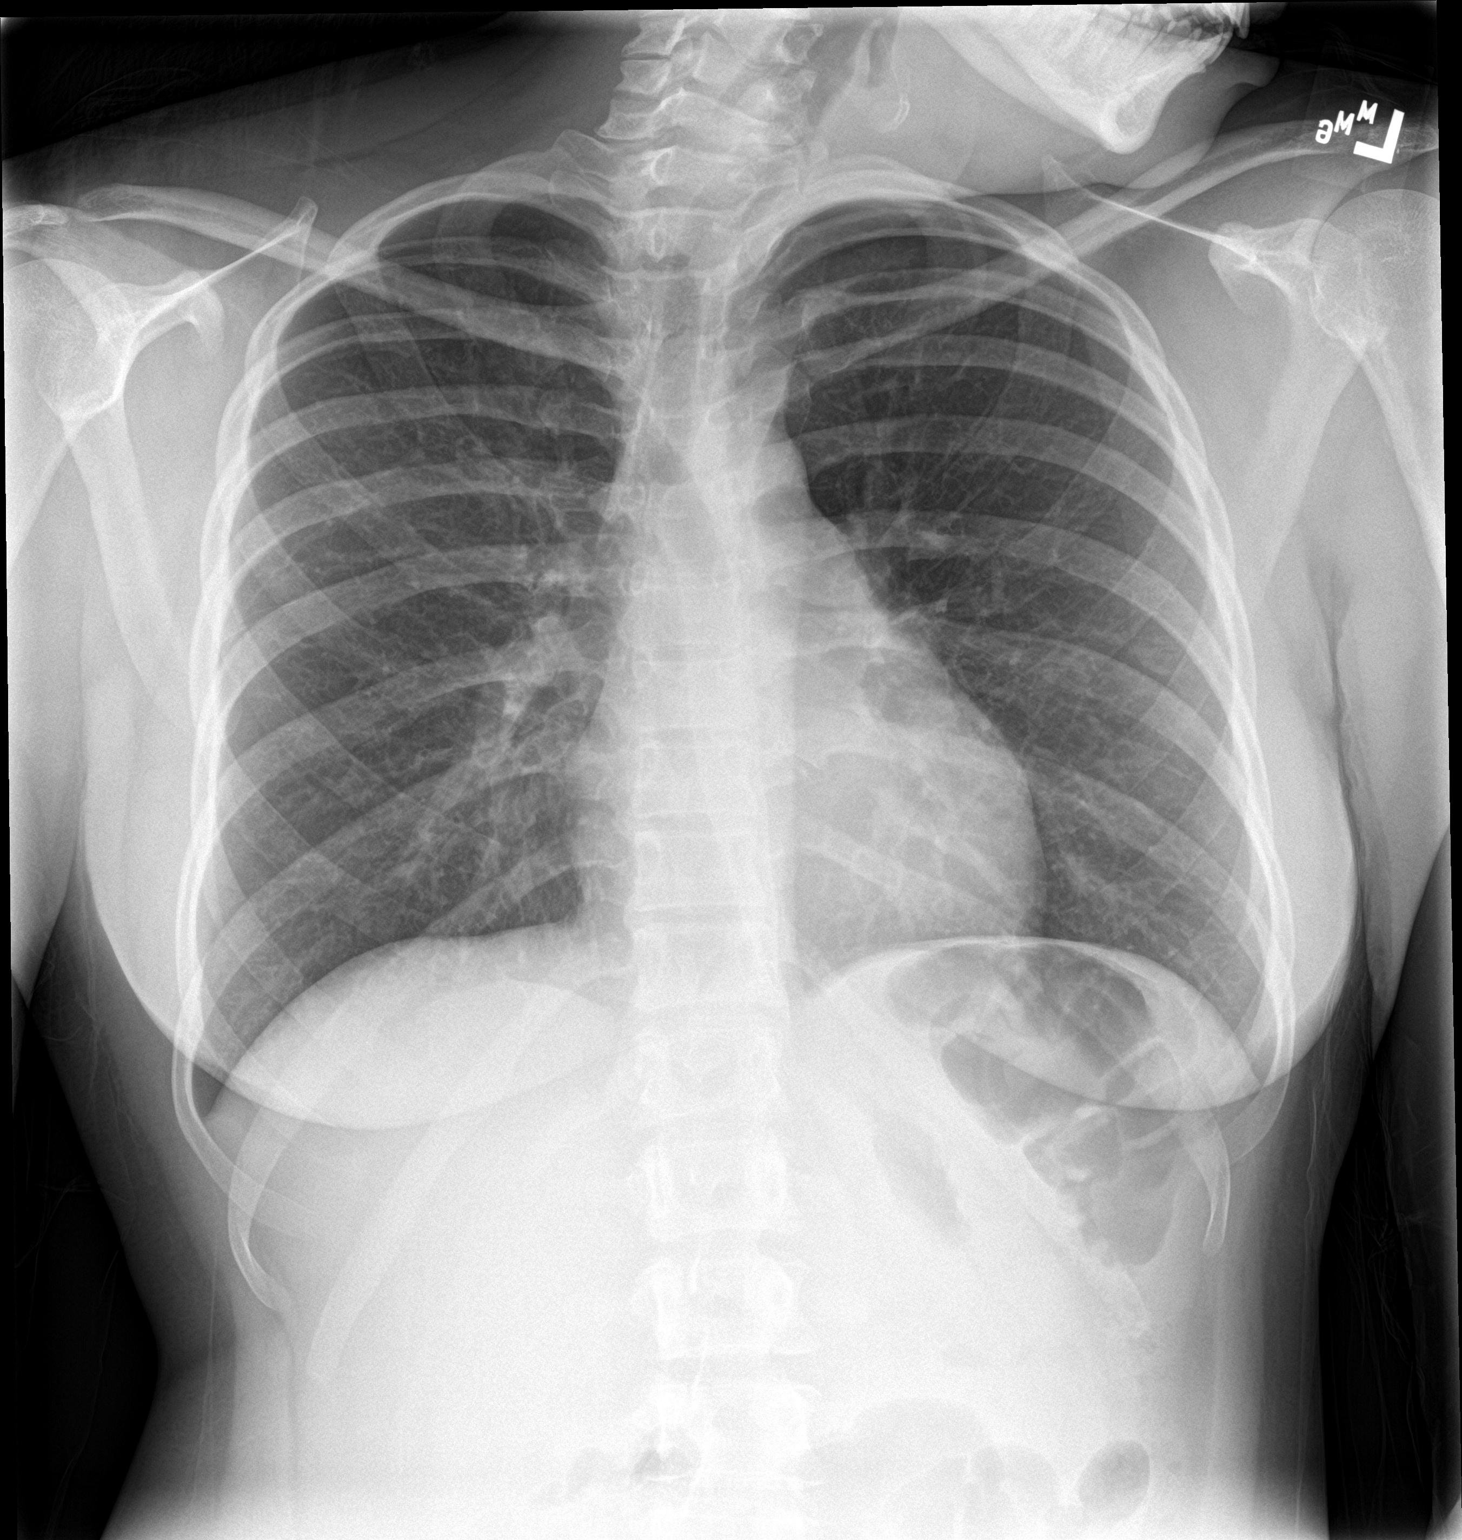

[chest lat]
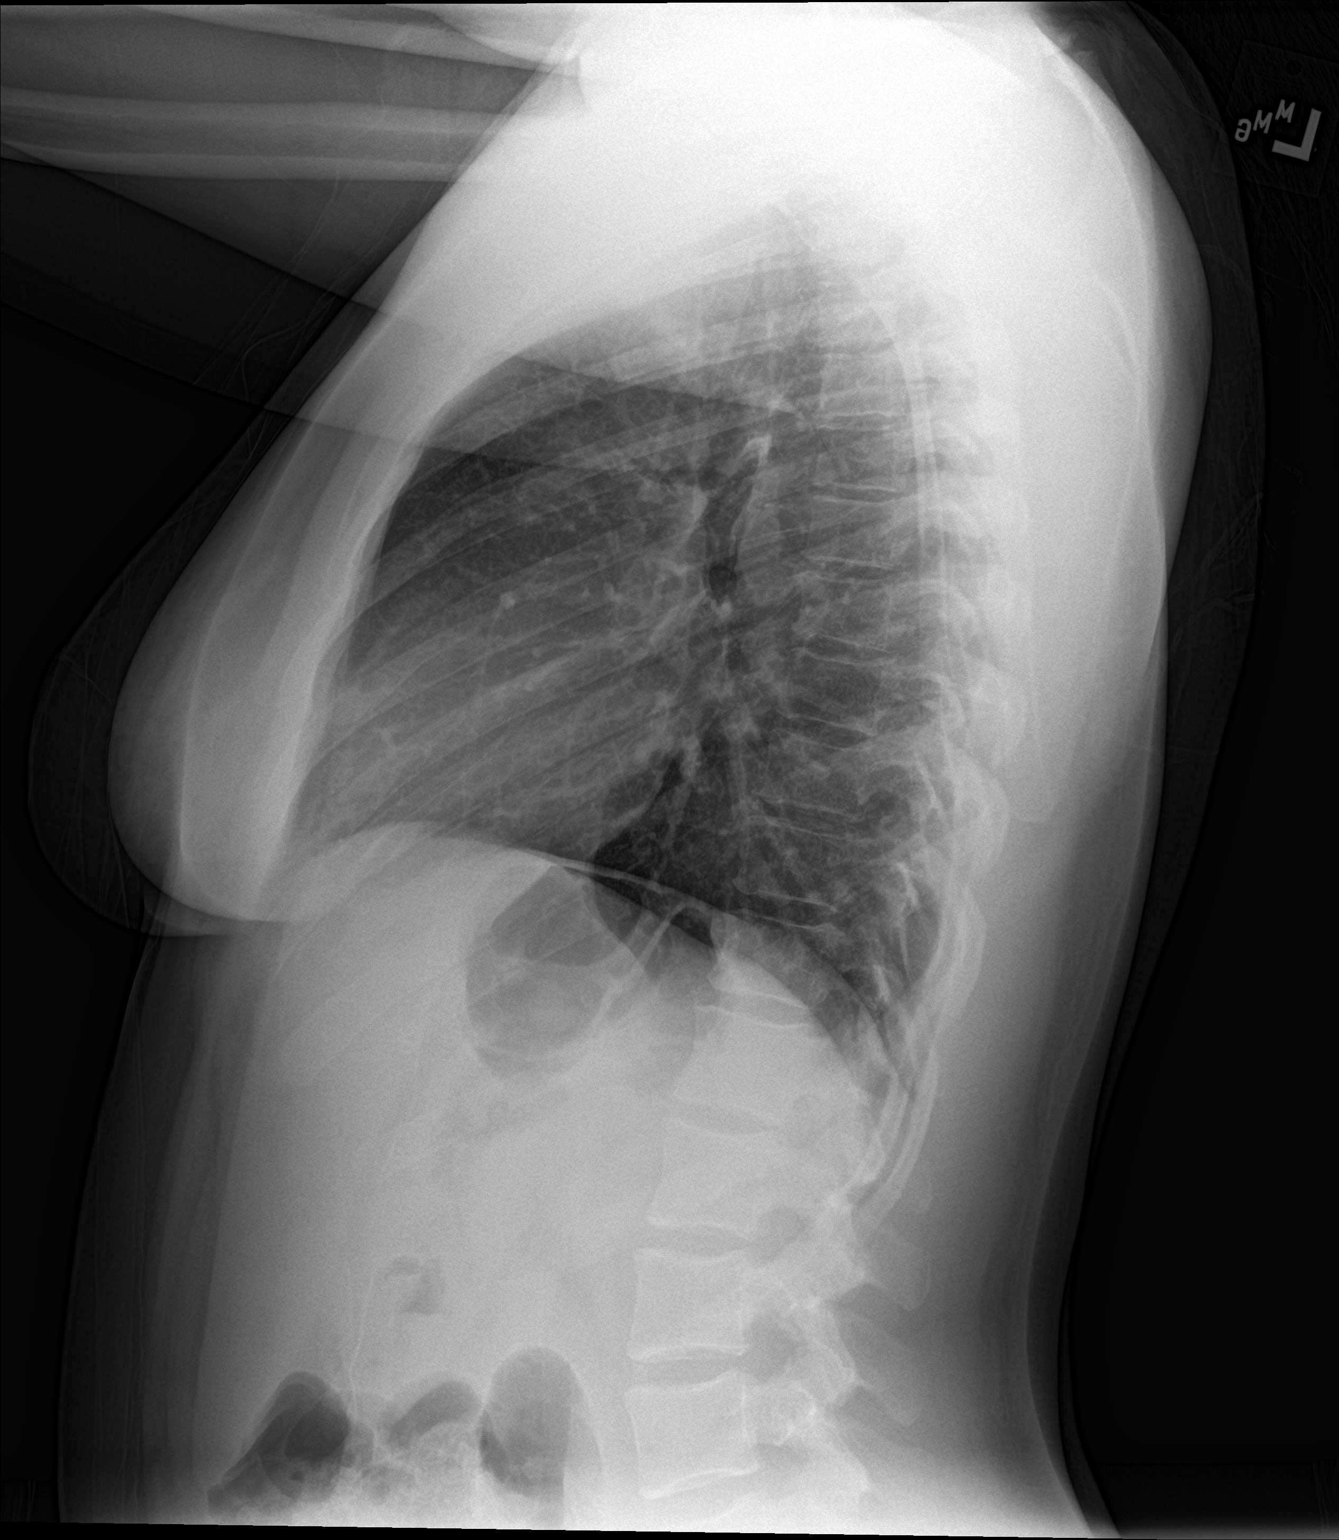

[2 of 2 positions shown; findings below may reference images not displayed]

FINDINGS: The heart size and mediastinal contours are within normal limits.
Both lungs are clear. The visualized skeletal structures are
unremarkable.
IMPRESSION: No active cardiopulmonary disease.

## 2019-01-17 ENCOUNTER — Telehealth: Payer: Self-pay

## 2019-01-20 ENCOUNTER — Ambulatory Visit (INDEPENDENT_AMBULATORY_CARE_PROVIDER_SITE_OTHER)
Admission: RE | Admit: 2019-01-20 | Discharge: 2019-01-20 | Disposition: A | Discharge status: Other Acute Inpatient Hospital | Payer: Self-pay | Source: Ambulatory Visit

## 2019-01-20 ENCOUNTER — Telehealth: Payer: Self-pay | Admitting: Family

## 2019-01-20 DIAGNOSIS — M5441 Lumbago with sciatica, right side: Secondary | ICD-10-CM

## 2019-01-20 NOTE — ED Provider Notes (Addendum)
Patient will be seen in person.   Sharion Balloon, NP 01/20/19 1632    Sharion Balloon, NP 01/20/19 [phone]

## 2019-01-20 NOTE — Progress Notes (Signed)
Based on what you shared with me, I feel your condition warrants further evaluation and I recommend that you be seen for a face to face office visit.  Given your back started after an injury, you need to be seen face to face to rule out other serious injuries.    NOTE: If you entered your credit card information for this eVisit, you will not be charged. You may see a "hold" on your card for the $35 but that hold will drop off and you will not have a charge processed.  If you are having a true medical emergency please call 911.     For an urgent face to face visit, [place] has four urgent care centers for your convenience:   . [facility]    [phone]                  Get Driving Directions  [address], [place] [address] . 10 am to 8 pm Monday-Friday . 12 pm to 8 pm Saturday-Sunday   . [facility] at [place]                  Get Driving Directions  [address], [place], [place] [address] . 8 am to 8 pm Monday-Friday . 9 am to 6 pm Saturday . 11 am to 6 pm Sunday   . [facility] at [place]                  Get Driving Directions   [address], [place] [address] . 8 am to 8 pm Monday-Friday . 8 am to 4 pm Saturday-Sunday    . [facility] at [place]                    Get Driving Directions  [phone]  [address], [place], [place] [address]  . Monday-Friday, 12 PM to 6 PM    Your e-visit answers were reviewed by a board certified advanced clinical practitioner to complete your personal care plan.  Thank you for using e-Visits.

## 2020-04-26 ENCOUNTER — Telehealth: Payer: Self-pay | Admitting: Nurse Practitioner

## 2020-04-26 DIAGNOSIS — J329 Chronic sinusitis, unspecified: Secondary | ICD-10-CM

## 2020-04-26 MED ORDER — AMOXICILLIN-POT CLAVULANATE 875-125 MG PO TABS: 1.0000 | ORAL_TABLET | Freq: Two times a day (BID) | ORAL | 0 refills | Status: AC

## 2020-04-26 MED ORDER — PSEUDOEPH-BROMPHEN-DM 30-2-10 MG/5ML PO SYRP: 5.0000 mL | ORAL_SOLUTION | Freq: Four times a day (QID) | ORAL | 0 refills | Status: AC | PRN

## 2020-04-26 MED ORDER — FLUTICASONE PROPIONATE 50 MCG/ACT NA SUSP: 2.0000 | Freq: Every day | NASAL | 0 refills | Status: AC

## 2020-04-26 NOTE — Progress Notes (Signed)
We are sorry that you are not feeling well.  Here is how we plan to help!  Based on what you have shared with me it looks like you have sinusitis.  Sinusitis is inflammation and infection in the sinus cavities of the head.  Based on your presentation I believe you most likely have Acute Bacterial Sinusitis.  This is an infection caused by bacteria and is treated with antibiotics. I have prescribed Augmentin 875mg /125mg  one tablet twice daily with food, for 7 days. I am also sending a prescription for Flonase nasal spray, use 2 sprays in each nostril daily until symptoms improve. Saline nasal spray help and can safely be used as often as needed for congestion.  If you develop worsening sinus pain, fever or notice severe headache and vision changes, or if symptoms are not better after completion of antibiotic, please schedule an appointment with a health care provider.    For your cough, I am prescribing Bromfed DM. Take 49mL up to four times daily as needed for cough.   Sinus infections are not as easily transmitted as other respiratory infection, however we still recommend that you avoid close contact with loved ones, especially the very young and elderly.  Remember to wash your hands thoroughly throughout the day as this is the number one way to prevent the spread of infection!  Home Care:  Only take medications as instructed by your medical team.  Complete the entire course of an antibiotic.  Do not take these medications with alcohol.  A steam or ultrasonic humidifier can help congestion.  You can place a towel over your head and breathe in the steam from hot water coming from a faucet.  Avoid close contacts especially the very young and the elderly.  Cover your mouth when you cough or sneeze.  Always remember to wash your hands.  Get Help Right Away If:  You develop worsening fever or sinus pain.  You develop a severe head ache or visual changes.  Your symptoms persist after you have  completed your treatment plan.  Make sure you  Understand these instructions.  Will watch your condition.  Will get help right away if you are not doing well or get worse.  Your e-visit answers were reviewed by a board certified advanced clinical practitioner to complete your personal care plan.  Depending on the condition, your plan could have included both over the counter or prescription medications.  If there is a problem please reply  once you have received a response from your provider.  Your safety is important to 4m.  If you have drug allergies check your prescription carefully.    You can use MyChart to ask questions about today's visit, request a non-urgent call back, or ask for a work or school excuse for 24 hours related to this e-Visit. If it has been greater than 24 hours you will need to follow up with your provider, or enter a new e-Visit to address those concerns.  You will get an e-mail in the next two days asking about your experience.  I hope that your e-visit has been valuable and will speed your recovery. Thank you for using e-visits.  I have spent at least 5 minutes reviewing and documenting in the patient's chart.

## 2020-06-11 ENCOUNTER — Ambulatory Visit (HOSPITAL_COMMUNITY): Payer: No Payment, Other | Admitting: Behavioral Health

## 2021-04-23 ENCOUNTER — Ambulatory Visit: Payer: Self-pay | Admitting: Orthopedic Surgery
